# Patient Record
Sex: Female | Born: 1985 | Race: White | Hispanic: No | Marital: Single | Smoking: Current every day smoker
Health system: Southern US, Community
[De-identification: ages and names within clinical notes are randomized; demographics above are authoritative.]

## PROBLEM LIST (undated history)

## (undated) DIAGNOSIS — E041 Nontoxic single thyroid nodule: Secondary | ICD-10-CM

## (undated) DIAGNOSIS — E1065 Type 1 diabetes mellitus with hyperglycemia: Secondary | ICD-10-CM

## (undated) DIAGNOSIS — K219 Gastro-esophageal reflux disease without esophagitis: Secondary | ICD-10-CM

## (undated) DIAGNOSIS — E109 Type 1 diabetes mellitus without complications: Secondary | ICD-10-CM

## (undated) DIAGNOSIS — E039 Hypothyroidism, unspecified: Secondary | ICD-10-CM

## (undated) DIAGNOSIS — F17200 Nicotine dependence, unspecified, uncomplicated: Secondary | ICD-10-CM

## (undated) HISTORY — DX: Nontoxic single thyroid nodule: E04.1

## (undated) HISTORY — DX: Nicotine dependence, unspecified, uncomplicated: F17.200

## (undated) HISTORY — DX: Type 1 diabetes mellitus without complications: E10.9

## (undated) HISTORY — DX: Gastro-esophageal reflux disease without esophagitis: K21.9

## (undated) HISTORY — DX: Hypothyroidism, unspecified: E03.9

## (undated) HISTORY — DX: Type 1 diabetes mellitus with hyperglycemia: E10.65

---
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
## 2003-02-12 ENCOUNTER — Emergency Department (HOSPITAL_COMMUNITY): Admission: EM | Admit: 2003-02-12 | Discharge: 2003-02-12 | Payer: Self-pay | Admitting: Emergency Medicine

## 2003-02-12 ENCOUNTER — Encounter: Payer: Self-pay | Admitting: Emergency Medicine

## 2003-12-27 ENCOUNTER — Ambulatory Visit (HOSPITAL_COMMUNITY): Admission: RE | Admit: 2003-12-27 | Discharge: 2003-12-27 | Payer: Self-pay | Admitting: *Deleted

## 2004-05-05 ENCOUNTER — Inpatient Hospital Stay (HOSPITAL_COMMUNITY): Admission: AD | Admit: 2004-05-05 | Discharge: 2004-05-07 | Payer: Self-pay | Admitting: Obstetrics & Gynecology

## 2004-05-05 ENCOUNTER — Ambulatory Visit: Payer: Self-pay | Admitting: Obstetrics & Gynecology

## 2004-08-05 ENCOUNTER — Emergency Department (HOSPITAL_COMMUNITY): Admission: EM | Admit: 2004-08-05 | Discharge: 2004-08-05 | Payer: Self-pay | Admitting: Emergency Medicine

## 2005-01-12 ENCOUNTER — Emergency Department (HOSPITAL_COMMUNITY): Admission: EM | Admit: 2005-01-12 | Discharge: 2005-01-12 | Payer: Self-pay | Admitting: Emergency Medicine

## 2006-02-23 ENCOUNTER — Emergency Department (HOSPITAL_COMMUNITY): Admission: EM | Admit: 2006-02-23 | Discharge: 2006-02-23 | Payer: Self-pay | Admitting: Emergency Medicine

## 2006-02-26 ENCOUNTER — Emergency Department (HOSPITAL_COMMUNITY): Admission: EM | Admit: 2006-02-26 | Discharge: 2006-02-26 | Payer: Self-pay | Admitting: Emergency Medicine

## 2006-10-02 ENCOUNTER — Ambulatory Visit: Payer: Self-pay | Admitting: Endocrinology

## 2006-10-16 ENCOUNTER — Ambulatory Visit: Payer: Self-pay | Admitting: Endocrinology

## 2006-10-16 LAB — CONVERTED CEMR LAB: TSH: 11.68 microintl units/mL — ABNORMAL HIGH (ref 0.35–5.50)

## 2006-11-24 ENCOUNTER — Ambulatory Visit: Payer: Self-pay | Admitting: Endocrinology

## 2006-11-24 LAB — CONVERTED CEMR LAB: TSH: 3.93 microintl units/mL (ref 0.35–5.50)

## 2006-12-10 ENCOUNTER — Ambulatory Visit: Payer: Self-pay | Admitting: Oncology

## 2006-12-25 ENCOUNTER — Inpatient Hospital Stay (HOSPITAL_COMMUNITY): Admission: AD | Admit: 2006-12-25 | Discharge: 2006-12-25 | Payer: Self-pay | Admitting: Obstetrics and Gynecology

## 2007-01-27 ENCOUNTER — Ambulatory Visit: Payer: Self-pay | Admitting: Endocrinology

## 2007-01-27 LAB — CONVERTED CEMR LAB: TSH: 3.92 microintl units/mL (ref 0.35–5.50)

## 2007-02-09 ENCOUNTER — Inpatient Hospital Stay (HOSPITAL_COMMUNITY): Admission: AD | Admit: 2007-02-09 | Discharge: 2007-02-09 | Payer: Self-pay | Admitting: Obstetrics and Gynecology

## 2007-02-25 ENCOUNTER — Ambulatory Visit (HOSPITAL_COMMUNITY): Admission: RE | Admit: 2007-02-25 | Discharge: 2007-02-25 | Payer: Self-pay | Admitting: Obstetrics and Gynecology

## 2007-02-28 ENCOUNTER — Inpatient Hospital Stay (HOSPITAL_COMMUNITY): Admission: AD | Admit: 2007-02-28 | Discharge: 2007-02-28 | Payer: Self-pay | Admitting: Obstetrics and Gynecology

## 2007-03-05 ENCOUNTER — Inpatient Hospital Stay (HOSPITAL_COMMUNITY): Admission: AD | Admit: 2007-03-05 | Discharge: 2007-03-07 | Payer: Self-pay | Admitting: Obstetrics and Gynecology

## 2007-03-12 ENCOUNTER — Encounter: Payer: Self-pay | Admitting: *Deleted

## 2007-03-12 DIAGNOSIS — E039 Hypothyroidism, unspecified: Secondary | ICD-10-CM | POA: Insufficient documentation

## 2007-03-12 DIAGNOSIS — F17200 Nicotine dependence, unspecified, uncomplicated: Secondary | ICD-10-CM

## 2007-03-12 HISTORY — DX: Nicotine dependence, unspecified, uncomplicated: F17.200

## 2007-03-12 HISTORY — DX: Hypothyroidism, unspecified: E03.9

## 2008-02-18 ENCOUNTER — Ambulatory Visit: Payer: Self-pay | Admitting: Endocrinology

## 2008-02-18 DIAGNOSIS — E041 Nontoxic single thyroid nodule: Secondary | ICD-10-CM

## 2008-02-18 HISTORY — DX: Nontoxic single thyroid nodule: E04.1

## 2008-02-18 LAB — CONVERTED CEMR LAB: TSH: 0.07 microintl units/mL — ABNORMAL LOW (ref 0.35–5.50)

## 2008-03-07 ENCOUNTER — Encounter: Admission: RE | Admit: 2008-03-07 | Discharge: 2008-03-07 | Payer: Self-pay | Admitting: Endocrinology

## 2008-05-02 ENCOUNTER — Ambulatory Visit: Payer: Self-pay | Admitting: Endocrinology

## 2008-05-02 DIAGNOSIS — IMO0002 Reserved for concepts with insufficient information to code with codable children: Secondary | ICD-10-CM

## 2008-05-02 DIAGNOSIS — E1065 Type 1 diabetes mellitus with hyperglycemia: Secondary | ICD-10-CM

## 2008-05-02 HISTORY — DX: Type 1 diabetes mellitus with hyperglycemia: E10.65

## 2008-05-02 HISTORY — DX: Reserved for concepts with insufficient information to code with codable children: IMO0002

## 2008-05-02 LAB — CONVERTED CEMR LAB: Blood Glucose, Fingerstick: 203

## 2008-05-03 ENCOUNTER — Telehealth (INDEPENDENT_AMBULATORY_CARE_PROVIDER_SITE_OTHER): Payer: Self-pay | Admitting: *Deleted

## 2008-05-03 ENCOUNTER — Encounter: Payer: Self-pay | Admitting: Endocrinology

## 2008-05-03 ENCOUNTER — Telehealth: Payer: Self-pay | Admitting: Endocrinology

## 2008-05-04 ENCOUNTER — Encounter: Admission: RE | Admit: 2008-05-04 | Discharge: 2008-05-04 | Payer: Self-pay | Admitting: Endocrinology

## 2008-05-04 ENCOUNTER — Encounter: Payer: Self-pay | Admitting: Endocrinology

## 2008-05-05 ENCOUNTER — Telehealth (INDEPENDENT_AMBULATORY_CARE_PROVIDER_SITE_OTHER): Payer: Self-pay | Admitting: *Deleted

## 2008-05-08 ENCOUNTER — Encounter (INDEPENDENT_AMBULATORY_CARE_PROVIDER_SITE_OTHER): Payer: Self-pay | Admitting: *Deleted

## 2008-05-17 ENCOUNTER — Ambulatory Visit: Payer: Self-pay | Admitting: Endocrinology

## 2008-05-18 LAB — CONVERTED CEMR LAB: TSH: 1.93 microintl units/mL (ref 0.35–5.50)

## 2008-06-13 ENCOUNTER — Emergency Department (HOSPITAL_COMMUNITY): Admission: EM | Admit: 2008-06-13 | Discharge: 2008-06-14 | Payer: Self-pay | Admitting: Emergency Medicine

## 2008-06-13 ENCOUNTER — Telehealth: Payer: Self-pay | Admitting: Internal Medicine

## 2008-06-26 ENCOUNTER — Ambulatory Visit: Payer: Self-pay | Admitting: Endocrinology

## 2008-06-26 LAB — CONVERTED CEMR LAB: Hgb A1c MFr Bld: 5.2 % (ref 4.6–6.0)

## 2008-07-04 ENCOUNTER — Telehealth: Payer: Self-pay | Admitting: Endocrinology

## 2008-07-04 ENCOUNTER — Ambulatory Visit: Payer: Self-pay | Admitting: Internal Medicine

## 2008-07-04 DIAGNOSIS — E109 Type 1 diabetes mellitus without complications: Secondary | ICD-10-CM

## 2008-07-04 HISTORY — DX: Type 1 diabetes mellitus without complications: E10.9

## 2008-07-04 LAB — CONVERTED CEMR LAB
Inflenza A Ag: NEGATIVE
Influenza B Ag: NEGATIVE

## 2008-07-05 ENCOUNTER — Telehealth: Payer: Self-pay | Admitting: Internal Medicine

## 2008-07-26 ENCOUNTER — Telehealth: Payer: Self-pay | Admitting: Internal Medicine

## 2008-08-01 ENCOUNTER — Telehealth: Payer: Self-pay | Admitting: Internal Medicine

## 2008-09-25 ENCOUNTER — Ambulatory Visit: Payer: Self-pay | Admitting: Endocrinology

## 2008-09-25 LAB — CONVERTED CEMR LAB
Hgb A1c MFr Bld: 5.4 % (ref 4.6–6.5)
TSH: 11.29 microintl units/mL — ABNORMAL HIGH (ref 0.35–5.50)

## 2008-10-24 ENCOUNTER — Telehealth (INDEPENDENT_AMBULATORY_CARE_PROVIDER_SITE_OTHER): Payer: Self-pay | Admitting: *Deleted

## 2009-01-03 ENCOUNTER — Ambulatory Visit: Payer: Self-pay | Admitting: Internal Medicine

## 2009-01-03 DIAGNOSIS — R1013 Epigastric pain: Secondary | ICD-10-CM

## 2009-01-03 DIAGNOSIS — K219 Gastro-esophageal reflux disease without esophagitis: Secondary | ICD-10-CM

## 2009-01-03 HISTORY — DX: Gastro-esophageal reflux disease without esophagitis: K21.9

## 2009-01-03 LAB — CONVERTED CEMR LAB
ALT: 15 units/L (ref 0–35)
AST: 21 units/L (ref 0–37)
Albumin: 4.2 g/dL (ref 3.5–5.2)
Alkaline Phosphatase: 56 units/L (ref 39–117)
Amylase: 56 units/L (ref 27–131)
BUN: 6 mg/dL (ref 6–23)
Basophils Absolute: 0.1 10*3/uL (ref 0.0–0.1)
Basophils Relative: 0.7 % (ref 0.0–3.0)
Bilirubin Urine: NEGATIVE
Bilirubin, Direct: 0 mg/dL (ref 0.0–0.3)
CO2: 31 meq/L (ref 19–32)
Calcium: 9 mg/dL (ref 8.4–10.5)
Chloride: 107 meq/L (ref 96–112)
Creatinine, Ser: 0.7 mg/dL (ref 0.4–1.2)
Eosinophils Absolute: 0 10*3/uL (ref 0.0–0.7)
Eosinophils Relative: 0.1 % (ref 0.0–5.0)
GFR calc non Af Amer: 107.95 mL/min (ref >60)
Glucose, Bld: 92 mg/dL (ref 70–99)
HCT: 41.3 % (ref 36.0–46.0)
Hemoglobin: 14.2 g/dL (ref 12.0–15.0)
Hgb A1c MFr Bld: 5.1 % (ref 4.6–6.5)
Ketones, ur: NEGATIVE mg/dL
Leukocytes, UA: NEGATIVE
Lipase: 10 units/L — ABNORMAL LOW (ref 11.0–59.0)
Lymphocytes Relative: 18.7 % (ref 12.0–46.0)
Lymphs Abs: 1.5 10*3/uL (ref 0.7–4.0)
MCHC: 34.2 g/dL (ref 30.0–36.0)
MCV: 96.7 fL (ref 78.0–100.0)
Monocytes Absolute: 0.3 10*3/uL (ref 0.1–1.0)
Monocytes Relative: 4.1 % (ref 3.0–12.0)
Neutro Abs: 6.2 10*3/uL (ref 1.4–7.7)
Neutrophils Relative %: 76.4 % (ref 43.0–77.0)
Nitrite: NEGATIVE
Platelets: 183 10*3/uL (ref 150.0–400.0)
Potassium: 4 meq/L (ref 3.5–5.1)
RBC: 4.27 M/uL (ref 3.87–5.11)
RDW: 12.3 % (ref 11.5–14.6)
Sodium: 143 meq/L (ref 135–145)
Specific Gravity, Urine: 1.015 (ref 1.000–1.030)
TSH: 8.69 microintl units/mL — ABNORMAL HIGH (ref 0.35–5.50)
Total Bilirubin: 0.5 mg/dL (ref 0.3–1.2)
Total Protein, Urine: NEGATIVE mg/dL
Total Protein: 7 g/dL (ref 6.0–8.3)
Urine Glucose: NEGATIVE mg/dL
Urobilinogen, UA: 0.2 (ref 0.0–1.0)
WBC: 8.1 10*3/uL (ref 4.5–10.5)
pH: 7 (ref 5.0–8.0)

## 2009-01-03 LAB — HM DIABETES FOOT EXAM

## 2009-01-04 ENCOUNTER — Encounter: Payer: Self-pay | Admitting: Internal Medicine

## 2009-01-04 ENCOUNTER — Telehealth: Payer: Self-pay | Admitting: Internal Medicine

## 2009-06-05 ENCOUNTER — Ambulatory Visit: Payer: Self-pay | Admitting: Diagnostic Radiology

## 2009-06-05 ENCOUNTER — Emergency Department (HOSPITAL_BASED_OUTPATIENT_CLINIC_OR_DEPARTMENT_OTHER): Admission: EM | Admit: 2009-06-05 | Discharge: 2009-06-05 | Payer: Self-pay | Admitting: Emergency Medicine

## 2009-10-27 ENCOUNTER — Ambulatory Visit: Payer: Self-pay | Admitting: Diagnostic Radiology

## 2009-10-27 ENCOUNTER — Emergency Department (HOSPITAL_BASED_OUTPATIENT_CLINIC_OR_DEPARTMENT_OTHER): Admission: EM | Admit: 2009-10-27 | Discharge: 2009-10-27 | Payer: Self-pay | Admitting: Emergency Medicine

## 2009-10-29 ENCOUNTER — Emergency Department (HOSPITAL_BASED_OUTPATIENT_CLINIC_OR_DEPARTMENT_OTHER): Admission: EM | Admit: 2009-10-29 | Discharge: 2009-10-29 | Payer: Self-pay | Admitting: Emergency Medicine

## 2009-12-26 ENCOUNTER — Ambulatory Visit: Payer: Self-pay | Admitting: Endocrinology

## 2009-12-26 LAB — CONVERTED CEMR LAB
BUN: 9 mg/dL (ref 6–23)
Bilirubin Urine: NEGATIVE
CO2: 30 meq/L (ref 19–32)
Calcium: 9.2 mg/dL (ref 8.4–10.5)
Chloride: 102 meq/L (ref 96–112)
Cholesterol: 195 mg/dL (ref 0–200)
Creatinine, Ser: 0.8 mg/dL (ref 0.4–1.2)
Creatinine,U: 270.7 mg/dL
GFR calc non Af Amer: 91.83 mL/min (ref >60)
Glucose, Bld: 95 mg/dL (ref 70–99)
HDL: 101.3 mg/dL (ref >39.00)
Hemoglobin, Urine: NEGATIVE
Hgb A1c MFr Bld: 5.3 % (ref 4.6–6.5)
Ketones, ur: NEGATIVE mg/dL
LDL Cholesterol: 74 mg/dL (ref 0–99)
Leukocytes, UA: NEGATIVE
Microalb Creat Ratio: 1 mg/g (ref 0.0–30.0)
Microalb, Ur: 2.7 mg/dL — ABNORMAL HIGH (ref 0.0–1.9)
Nitrite: NEGATIVE
Potassium: 3.7 meq/L (ref 3.5–5.1)
Sodium: 140 meq/L (ref 135–145)
Specific Gravity, Urine: 1.025 (ref 1.000–1.030)
TSH: 5.22 microintl units/mL (ref 0.35–5.50)
Total CHOL/HDL Ratio: 2
Total Protein, Urine: NEGATIVE mg/dL
Triglycerides: 101 mg/dL (ref 0.0–149.0)
Urine Glucose: NEGATIVE mg/dL
Urobilinogen, UA: 0.2 (ref 0.0–1.0)
VLDL: 20.2 mg/dL (ref 0.0–40.0)
pH: 6.5 (ref 5.0–8.0)

## 2010-02-07 ENCOUNTER — Ambulatory Visit: Payer: Self-pay | Admitting: Endocrinology

## 2010-06-30 ENCOUNTER — Emergency Department (HOSPITAL_BASED_OUTPATIENT_CLINIC_OR_DEPARTMENT_OTHER)
Admission: EM | Admit: 2010-06-30 | Discharge: 2010-06-30 | Payer: Self-pay | Source: Home / Self Care | Admitting: Emergency Medicine

## 2010-07-03 LAB — GLUCOSE, CAPILLARY: Glucose-Capillary: 228 mg/dL — ABNORMAL HIGH (ref 70–99)

## 2010-07-16 NOTE — Assessment & Plan Note (Signed)
Summary: MED FU---STC   Vital Signs:  Patient profile:   25 year old female Height:      65 inches (165.10 cm) Weight:      105 pounds (47.73 kg) BMI:     17.54 O2 Sat:      97 % on Room air Temp:     98.7 degrees F (37.06 degrees C) oral Pulse rate:   67 / minute Pulse rhythm:   regular BP sitting:   126 / 74  (left arm) Cuff size:   regular  Vitals Entered By: Brenton Grills MA (December 26, 2009 10:56 AM)  O2 Flow:  Room air CC: Med F/U/refill request on Levothroid/Pt is not taking Flonase, Darvocet, Zantac or Protonix/aj   Primary Provider:  Minus Breeding MD  CC:  Med F/U/refill request on Levothroid/Pt is not taking Flonase, Darvocet, and Zantac or Protonix/aj.  History of Present Illness: dm:  no cbg record, but states cbg's are sometimes low before 1st meal of the day, or before lunch.  she says she has sometimes does not take insulin for a few days at a time, and cbg stays well-controlled. she says "sometimes i feel weak."  she says cbg goes "high (160) if i drink 2-3 beers." hypothyroidism:  she has been out of synthroid x 2 weeks.  no weight change.  Current Medications (verified): 1)  Flonase 50 Mcg/act Susp (Fluticasone Propionate) .... Use 1 Spray Each Nostril Two Times A Day 2)  Humalog Kwikpen 100 Unit/ml Soln (Insulin Lispro (Human)) .Marland Kitchen.. 1 Unit Three Times A Day (Qac) 3)  Bd U/f Short Pen Needle 31g X 8 Mm Misc (Insulin Pen Needle) .... Tid 4)  Prodigy Blood Glucose Test  Strp (Glucose Blood) .... Three Times A Day, and Lancets 250.01 5)  Levothroid 175 Mcg Tabs (Levothyroxine Sodium) .... Once Daily 6)  Zantac 150 Mg Caps (Ranitidine Hcl) .... At Bedtime 7)  Protonix 40 Mg Pack (Pantoprazole Sodium) .... Once Daily For Heartburn  Allergies (verified): No Known Drug Allergies  Past History:  Past Medical History: Last updated: 01/03/2009 Hypothyroidism Diabetes mellitus, type I GERD  Review of Systems  The patient denies dyspnea on exertion.     Physical Exam  General:  normal appearance.   Neck:  Supple without thyroid enlargement or tenderness.  Pulses:  dorsalis pedis intact bilat.  no carotid bruit  Extremities:  no deformity.  no ulcer on the feet.  feet are of normal color and temp.  no edema  Neurologic:  sensation is intact to touch on the feet    Impression & Recommendations:  Problem # 1:  DIABETES MELLITUS, TYPE I (ICD-250.01) she does not appear to need insulin  Problem # 2:  HYPOTHYROIDISM (ICD-244.9) she does not appear to need synthroid  Problem # 3:  SMOKER (ICD-305.1) persistent  Medications Added to Medication List This Visit: 1)  Humalog Kwikpen 100 Unit/ml Soln (Insulin lispro (human)) .Marland Kitchen.. 1 unit as needed for any blood sugar over 150, and pen needles # 30 2)  Prodigy Blood Glucose Test Strp (Glucose blood) .... Two times a day, and lancets 250.01  Other Orders: TLB-TSH (Thyroid Stimulating Hormone) (84443-TSH) TLB-A1C / Hgb A1C (Glycohemoglobin) (83036-A1C) TLB-Microalbumin/Creat Ratio, Urine (82043-MALB) TLB-Udip w/ Micro (81001-URINE) TLB-BMP (Basic Metabolic Panel-BMET) (80048-METABOL) TLB-Lipid Panel (80061-LIPID) Est. Patient Level III (16109)  Patient Instructions: 1)  blood tests are being ordered for you today.  please call [phone] to hear your test results. 2)  pending the test results, do not  take insulin on a regular basis.  just take 1 unit any time it is over 150. 3)  check your blood sugar 2 times a day.  vary the time of day when you check, between before the 3 meals, and at bedtime.  also check if you have symptoms of your blood sugar being too high or too low.  please keep a record of the readings and bring it to your next appointment here.  please call us sooner if you are having low blood sugar episodes. 4)  return asap if another pregnancy happens.  5)  resume synthroid at 175 micrograms/day.   6)  cc dr Ellyn Hack. 7)  Please schedule a follow-up appointment in 6  weeks. 8)  Quitting smoking can really improve your health.  Join a smoking cessation program now.    9)  (update: i left message on phone-tree:  rx as we discussed, except don't take synthroid). Prescriptions: LEVOTHROID 175 MCG TABS (LEVOTHYROXINE SODIUM) once daily  #30 x 1   Entered and Authorized by:   Minus Breeding MD   Signed by:   Minus Breeding MD on 12/26/2009   Method used:   Electronically to        [facility]  [phone]* (retail)       [address]       [place], [place]  [address]       Ph: [phone] or [phone]       Fax: [phone]   RxID:   [id number] PRODIGY BLOOD GLUCOSE TEST  STRP (GLUCOSE BLOOD) two times a day, and lancets 250.01  #60 x 11   Entered and Authorized by:   Minus Breeding MD   Signed by:   Minus Breeding MD on 12/26/2009   Method used:   Electronically to        [facility]  [phone]* (retail)       [address]       [place], [place]  [address]       Ph: [phone] or [phone]       Fax: [phone]   RxID:   [id number] HUMALOG KWIKPEN 100 UNIT/ML SOLN (INSULIN LISPRO (HUMAN)) 1 unit as needed for any blood sugar over 150, and pen needles # 30  #1 box x 11   Entered and Authorized by:   Minus Breeding MD   Signed by:   Minus Breeding MD on 12/26/2009   Method used:   Electronically to        [facility]  [phone]* (retail)       [address]       [place], [place]  [address]       Ph: [phone] or [phone]       Fax: [phone]   RxID:   [id number]

## 2010-08-16 ENCOUNTER — Telehealth: Payer: Self-pay | Admitting: Endocrinology

## 2010-08-22 NOTE — Progress Notes (Signed)
Summary: REQ FOR RX's   Phone Note Call from Patient   Caller: [name] [phone] OR [phone] Summary of Call: Pt's mother called. Pt is incarcerated and needs written rx's for insulin and thyroid meds. Pt is awaiting a bed at rehab facility and will need rx's to bring with her.  Initial call taken by: Lamar Sprinkles, CMA,  August 16, 2010 1:53 PM  Follow-up for Phone Call        i have printed.  please note that i have not seen this pt in 8 months, and i know nothing about her health since then.  therefore these are prescriptions for last known dosages for her, but i don't know if these are right for her now.   Follow-up by: Minus Breeding MD,  August 16, 2010 2:01 PM  Additional Follow-up for Phone Call Additional follow up Details #1::        rx placed upfront in cabinet, ready for pickup. Pt's mother informed. Additional Follow-up by: Brenton Grills CMA Duncan Dull),  August 16, 2010 3:41 PM    Prescriptions: LEVOTHROID 175 MCG TABS (LEVOTHYROXINE SODIUM) once daily  #30 x 0   Entered and Authorized by:   Minus Breeding MD   Signed by:   Minus Breeding MD on 08/16/2010   Method used:   Print then Give to Patient   RxID:   [id number] PRODIGY BLOOD GLUCOSE TEST  STRP (GLUCOSE BLOOD) two times a day, and lancets 250.01  #60 x 0   Entered and Authorized by:   Minus Breeding MD   Signed by:   Minus Breeding MD on 08/16/2010   Method used:   Print then Give to Patient   RxID:   [id number] BD U/F SHORT PEN NEEDLE 31G X 8 MM MISC (INSULIN PEN NEEDLE) tid  #100 x 0   Entered and Authorized by:   Minus Breeding MD   Signed by:   Minus Breeding MD on 08/16/2010   Method used:   Print then Give to Patient   RxID:   [id number] HUMALOG KWIKPEN 100 UNIT/ML SOLN (INSULIN LISPRO (HUMAN)) 1 unit as needed for any blood sugar over 150, and pen needles # 30  #1 box x 0   Entered and Authorized by:   Minus Breeding MD   Signed by:   Minus Breeding MD on 08/16/2010   Method used:   Print then  Give to Patient   RxID:   [id number]

## 2010-08-26 ENCOUNTER — Telehealth: Payer: Self-pay | Admitting: Endocrinology

## 2010-09-02 LAB — DIFFERENTIAL
Basophils Absolute: 0 10*3/uL (ref 0.0–0.1)
Basophils Relative: 1 % (ref 0–1)
Eosinophils Absolute: 0 10*3/uL (ref 0.0–0.7)
Eosinophils Relative: 1 % (ref 0–5)
Lymphocytes Relative: 16 % (ref 12–46)
Lymphs Abs: 1.2 10*3/uL (ref 0.7–4.0)
Monocytes Absolute: 0.4 10*3/uL (ref 0.1–1.0)
Monocytes Relative: 6 % (ref 3–12)
Neutro Abs: 5.5 10*3/uL (ref 1.7–7.7)
Neutrophils Relative %: 77 % (ref 43–77)

## 2010-09-02 LAB — CBC
HCT: 38.8 % (ref 36.0–46.0)
Hemoglobin: 13.3 g/dL (ref 12.0–15.0)
MCHC: 34.4 g/dL (ref 30.0–36.0)
MCV: 95.5 fL (ref 78.0–100.0)
Platelets: 155 10*3/uL (ref 150–400)
RBC: 4.06 MIL/uL (ref 3.87–5.11)
RDW: 12.3 % (ref 11.5–15.5)
WBC: 7.1 10*3/uL (ref 4.0–10.5)

## 2010-09-02 LAB — SEDIMENTATION RATE: Sed Rate: 2 mm/hr (ref 0–22)

## 2010-09-02 LAB — GLUCOSE, CAPILLARY: Glucose-Capillary: 93 mg/dL (ref 70–99)

## 2010-09-03 NOTE — Progress Notes (Signed)
Summary: Rx refill req  Phone Note Refill Request Message from:  Patient on August 26, 2010 10:14 AM  Refills Requested: Medication #1:  LEVOTHROID 175 MCG TABS once daily.   Dosage confirmed as above?Dosage Confirmed   Supply Requested: 1 month Pt's mother states that pt need t go to Rehab with [facility] because they are not authorized to dispense this medication. Mother advised that there will be no further refills until OV.   Method Requested: Electronic Initial call taken by: Margaret Pyle, CMA,  August 26, 2010 10:14 AM    Prescriptions: LEVOTHROID 175 MCG TABS (LEVOTHYROXINE SODIUM) once daily  #30 x 1   Entered by:   Margaret Pyle, CMA   Authorized by:   Minus Breeding MD   Signed by:   Margaret Pyle, CMA on 08/26/2010   Method used:   Electronically to        [facility]* (retail)       [address]       [place], [place]  [address]       Ph: [phone]       Fax: [phone]   RxID:   [id number]

## 2010-10-29 NOTE — Discharge Summary (Signed)
[name] [name], [name] [name]                  ACCOUNT NO.:  [id number]   MEDICAL RECORD NO.:  [id number]          PATIENT TYPE:  INP   LOCATION:  9136                          FACILITY:  [facility]   PHYSICIAN:  Malachi Pro. Ambrose Mantle, M.D. DATE OF BIRTH:  1985/08/20   DATE OF ADMISSION:  03/05/2007  DATE OF DISCHARGE:  03/07/2007                               DISCHARGE SUMMARY   HISTORY OF PRESENT ILLNESS:  A 25 year old white single female, para 1-0-  0-1 gravida 2, EDC March 06, 2007, admitted for induction of labor.  Blood group and type A negative, negative antibody, nonreactive  serology, rubella immune, hepatitis B surface antigen negative, HIV  negative, GC and Chlamydia negative.  TSH 20.506, free T4 0.72.  One-  hour Glucola 154, 3-hour GTT 77, 148, 155 and 140.  Group B strep was  positive.  Ultrasound on October 13, 2006:  Average gestational age [redacted]  weeks 3 days, [facility] March 06, 2007.  She began prenatal care at our  office on November 17, 2006.  Pap suggested Trichomonas and the patient was  treated with metronidazole.  Ultrasound on November 17, 2006:  Average  gestational age [redacted] weeks 3 days, [facility] March 06, 2007.  Family history  positive for von Willebrand's disease.  Records receive did not indicate  a problem for the patient.  The patient was begun on Synthroid for  hypothyroidism on January 27, 2007.  Size was less than dates.  Nonstress  tests were begun and have been reactive, although the patient has not  been compliant with keeping appointments.  On December 08, 2006, urine was  positive for cocaine.  On January 20, 2007, urine was positive for cocaine  and cannabinoids.   PAST MEDICAL HISTORY:  No known allergies.   OPERATIONS:  Knee surgery, 1991.   ILLNESSES:  Hypothyroidism.   SOCIAL HISTORY:  Cocaine addiction.  Alcohol and tobacco:  Positive for  tobacco.   FAMILY HISTORY:  Father with high blood pressure; mother with thyroid  dysfunction and diabetes; brother with seizure  disorder; maternal  grandfather with lung cancer; maternal grandmother, breast and lung  cancer.  Family members have von Willebrand's.   PHYSICAL EXAM:  VITAL SIGNS:  Normal vital signs.  HEART AND LUNGS:  Normal.  ABDOMEN:  Soft, fundal height 31.5 cm.  PELVIC:  On March 04, 2007, cervix a fingertip, 20% vertex at a -2.   HOSPITAL COURSE:  The patient was admitted for penicillin and Pitocin  for the positive group B strep.  She received an epidural because the  nurse felt she was 3-cm dilated.  By 1:01 p.m., the Pitocin was at 6 mU  a minute, contractions were every 2-3 minutes, fetal heart tones were  normal and were at a low baseline of 105-120.  The cervix at that time  by my exam was only 2 cm, 25%.  The patient had a fetal heart rate  deceleration on 1 mU a minute of Pitocin; Pitocin was discontinued, but  at 6:15 p.m., the contractions were every 5 minutes.  The cervix was 4  cm, 70% to 80%, vertex at a -2.  At 7:41 p.m., Pitocin was at 1 mU a  minute, contractions every 2-4 minutes.  Cervix was 5+ centimeters, 90%,  vertex at a -1.  She progressed to full dilatation.  She pushed well and  delivered spontaneously ROA over an intact perineum a living female  infant, 6 pounds 0 ounces, Apgars of 9 at 1 and 9 at 5 minutes.  Placenta was intact, uterus normal, blood loss about 400 mL.  Dr. Ambrose Mantle  was in attendance.  Postpartum, the patient did well and was discharged  on the second postpartum day.  The patient was seen in consultation by  Social Services.  The patient was advised to follow up with outpatient  drug treatment.  The social worker felt she had a good support system.  The father of the baby was supportive.  There was an active plan given  to remain clean.  The patient demonstrated good insight into what  triggers her substance use and good coping strategies to deal with the  triggers.  She demonstrated a commitment to remaining clean.  Laboratory  data showed initial  hemoglobin of 12.6, hematocrit 36.5, white count  10,700, platelet count 158,000.  Followup hemoglobin 11.8.  Comprehensive metabolic profile was normal except for studies that are  usually out of range in pregnancy.  Estimated GFR was greater than 60.  Urine tested for benzodiazepines, cocaine, amphetamines, cannabinoids,  opiates and barbiturates all showed nothing in her system.  Urinalysis  was clean.  RPR nonreactive.  Workup for RhoGAM was done and the patient  received her RhoGAM on March 06, 2007.  I offered to circumcise the  baby, but the pediatrician felt the baby should be evaluated for  Willebrand's disease before doing the circumcision.   FINAL DIAGNOSIS:  Intrauterine pregnancy at 39+ weeks, delivered vertex.   OPERATION:  Spontaneous delivery, vertex.   FINAL CONDITION:  Improved.   INSTRUCTIONS:  Instructions include our regular discharge instruction  booklet.  Percocet 5/325, twenty tablets, one every 4-6 hours as needed  for pain is given at discharge.  The patient is advised to return to our  office in 2 weeks for drug screen and in 6 weeks for followup  examination.  I have counseled her about the fact that Percocet is a  potentially eventuating drug and that she should use it with caution.      Malachi Pro. Ambrose Mantle, M.D.  Electronically Signed     TFH/MEDQ  D:  03/07/2007  T:  03/08/2007  Job:  [id number]

## 2010-10-29 NOTE — Consult Note (Signed)
[facility]                          ENDOCRINOLOGY CONSULTATION   NAME:[name], [name] BATSON                         MRN:          [id number]  DATE:01/27/2007                            DOB:          [birth year]    REASON FOR VISIT:  Follow up thyroid.   HISTORY OF THE PRESENT ILLNESS:  A 25 year old woman who states with  hypothyroidism.  She states she feels well and she is now [redacted] weeks  pregnant.  She has only gained 14 pounds of weight during her pregnancy,  but she states that Dr. Ambrose Mantle has told her this is fine.   PAST MEDICAL HISTORY:  Cigarette smoker.   REVIEW OF SYSTEMS:  She denies tremor and palpitations.   PHYSICAL EXAMINATION:  Blood pressure 127/70, heart rate is 80,  temperature is 97.2, the weight is 137.  GENERAL:  No distress.  The thyroid is slightly and diffusely enlarged.  Skin not diaphoretic.  NEUROLOGIC:  No tremor.   LABORATORY STUDIES:  On January 27, 2007, TSH normal at 3.92.   IMPRESSION:  1. Well controlled hypothyroidism.  2. Thirty-five weeks gestation.   PLAN:  1. Same Synthroid.  2. Return in about 3 months, or sooner if necessary.     Sean A. Everardo All, MD  Electronically Signed    SAE/MedQ  DD: 01/31/2007  DT: 02/01/2007  Job #: [id number]   cc:   Malachi Pro. Ambrose Mantle, M.D.

## 2010-10-29 NOTE — Consult Note (Signed)
[facility]                          ENDOCRINOLOGY CONSULTATION   NAME:[name] [name], [name] [name]                         MRN:          [id number]  DATE:11/24/2006                            DOB:          February 01, 1986    REASON FOR VISIT:  Follow up thyroid.   HISTORY OF THE PRESENT ILLNESS:  A 25 year old woman who takes Synthroid  150 mcg a day.  She is now [redacted] weeks pregnant and is feeling much less  tired since she has been on this Synthroid.   PAST MEDICAL HISTORY:  1. She is a smoker.  2. Gravida 2, para 1.   REVIEW OF SYSTEMS:  A 10 pound weight gain so far in her pregnancy.   PHYSICAL EXAMINATION:  Blood pressure is 122/74, heart rate is 88,  temperature is 98.2, the weight is 132.  GENERAL:  No distress.  Skin not diaphoretic, no rash.  NECK:  The thyroid appears minimally enlarged on the right and about  twice normal size on the left, but there is no discrete nodule.  NEUROLOGIC:  Alert, oriented.  Does not appear anxious nor depressed and  there is no tremor.   LABORATORY STUDIES:  On November 24, 2006, TSH normal at 3.93.   IMPRESSION:  1. Chronic hypothyroidism.  She is adequately replaced.  2. Twenty-five weeks gestation.   PLAN:  1. Continue Synthroid 150 mcg a day for now.  2. Return in 30 days.     Sean A. Everardo All, MD  Electronically Signed    SAE/MedQ  DD: 11/26/2006  DT: 11/27/2006  Job #: [id number]   cc:   Malachi Pro. Ambrose Mantle, M.D.

## 2010-11-01 NOTE — Consult Note (Signed)
[facility]                          ENDOCRINOLOGY CONSULTATION   NAME:[name] [name], [name] [name]                         MRN:          [id number]  DATE:10/02/2006                            DOB:          August 22, 1985    REFERRING PHYSICIAN:  Malachi Pro. Ambrose Mantle, M.D.   REASON FOR REFERRAL:  Hypothyroidism.   HISTORY OF THE PRESENT ILLNESS:  This is a 25 year old woman who states  she was diagnosed with hypothyroidism at the age of 43.  She states she  was prescribed Synthroid and took it consistently until she ran out of  it in 2006 following the birth of her child.  She states she did not  request a refill and has been it off it since then.  She is now [redacted] weeks  pregnant and she has had several months of fatigue with slightly  associated dry skin.   PAST MEDICAL HISTORY:  Gravida 2, para 1.  The only other medical  problem  is that she is a smoker.   SOCIAL HISTORY:  The patient is single.  She is unemployed.   FAMILY HISTORY:  The patient's mother had a thyroidectomy for uncertain  reasons.   REVIEW OF SYSTEMS:  The patient has gained 15 pounds so far in her  pregnancy.  She states minimal depression.   PHYSICAL EXAMINATION:  VITAL SIGNS:  Blood pressure 111/63, heart rate  83, temperature 97.6 and the weight is 132.  GENERAL APPEARANCE:  In general she is in no distress.  SKIN:  I cannot appreciate dry skin on today's examination.  EYES:  No proptosis.  No periorbital swelling.  NECK:  Thyroid is three times the normal size, left greater than the  right, with an irregular surface, but no discrete nodule.  CHEST:  The chest is clear to auscultation.  No respiratory distress.  EXTREMITIES:  The extremities have no edema.  NEUROLOGIC EXAMINATION:  Neurologically she is alert and oriented.  She  does not appear anxious or depressed, and there is no tremor.   LABORATORY STUDIES:  Laboratory studies forwarded by Dr. Ambrose Mantle:  On  September 28, 2006 TSH was 20.5.   IMPRESSION:  1. Chronic hypothyroidism, which is usually autoimmune even when      diagnosed at age 9.  2. History of nonadherence with her prescription.  3. Seventeen-week gestation.  4. The patient's cigarette smoking can worsen her thyroid disease as      well as increase the risk of her pregnancy.  5. Symptoms of fatigue and dry skin, which may be thyroid-related.   PLAN:  1. I discussed with her the important risk to her health of      nonadherence with her thyroid medication.  She states she will      comply with it in the future.  2. I gave her a brochure about hypothyroidism.  3. Because of the need for prompt normalization in her thyroid levels      I have prescribed her Synthroid 150 mcg a day.  4. Return here in about two weeks.     Sean A. Everardo All,  MD  Electronically Signed    SAE/MedQ  DD: 10/04/2006  DT: 10/04/2006  Job #: [id number]   cc:   Tracey Harries, M.D.

## 2011-02-20 ENCOUNTER — Ambulatory Visit (INDEPENDENT_AMBULATORY_CARE_PROVIDER_SITE_OTHER): Payer: Medicaid Other | Admitting: Endocrinology

## 2011-02-20 ENCOUNTER — Encounter: Payer: Self-pay | Admitting: Endocrinology

## 2011-02-20 ENCOUNTER — Other Ambulatory Visit (INDEPENDENT_AMBULATORY_CARE_PROVIDER_SITE_OTHER): Payer: Medicaid Other

## 2011-02-20 DIAGNOSIS — E039 Hypothyroidism, unspecified: Secondary | ICD-10-CM

## 2011-02-20 DIAGNOSIS — F1911 Other psychoactive substance abuse, in remission: Secondary | ICD-10-CM

## 2011-02-20 DIAGNOSIS — E109 Type 1 diabetes mellitus without complications: Secondary | ICD-10-CM

## 2011-02-20 LAB — LIPID PANEL
HDL: 68.5 mg/dL (ref >39.00)
LDL Cholesterol: 85 mg/dL (ref 0–99)
Total CHOL/HDL Ratio: 3
Triglycerides: 194 mg/dL — ABNORMAL HIGH (ref 0.0–149.0)

## 2011-02-20 LAB — BASIC METABOLIC PANEL
CO2: 30 mEq/L (ref 19–32)
Calcium: 8.6 mg/dL (ref 8.4–10.5)
Chloride: 102 mEq/L (ref 96–112)
Potassium: 3.7 mEq/L (ref 3.5–5.1)
Sodium: 138 mEq/L (ref 135–145)

## 2011-02-20 LAB — HEMOGLOBIN A1C: Hgb A1c MFr Bld: 5.1 % (ref 4.6–6.5)

## 2011-02-20 MED ORDER — LEVOTHYROXINE SODIUM 100 MCG PO TABS: 100.0000 ug | ORAL_TABLET | Freq: Every day | ORAL | Status: AC

## 2011-02-20 NOTE — Patient Instructions (Addendum)
blood tests are being requested for you today.  please call [phone] to hear your test results.  You will be prompted to enter the 9-digit "MRN" number that appears at the top left of this page, followed by #.  Then you will hear the message. Based on the thyroid result, i'll send a prescription to your pharmacy.  In view of your medical condition, you should avoid pregnancy until we have decided it is safe.  If the pregnancy happens, please come in for an appointment, so we can follow your thyroid and blood sugar. Cc bovard (update: i left message on phone-tree:  Take synthroid 100 mcg/d.  i have sent a prescription to your pharmacy.  Please make a follow-up appointment in 3 months.  No need for insulin, but check cbg a few times a week.  Call if over 200. (update: i left message on phone-tree:  rx as we discussed)

## 2011-02-20 NOTE — Progress Notes (Signed)
  Subjective:    Patient ID: [name] [name], female    DOB: Jan 14, 1986, 25 y.o.   MRN: [id number]  HPI The state of at least three ongoing medical problems is addressed today: Pt says she has a good result from in inpatient drug-rehab program--no recent drugs.  dm:  she says she has sometimes does not recently take insulin, and cbg stays well-controlled. hypothyroidism:  she has been taking synthroid, 1/2 of a 175 mcg tab per day.   Past Medical History  Diagnosis Date  . THYROID NODULE, RIGHT 02/18/2008  . HYPOTHYROIDISM 03/12/2007  . DIABETES MELLITUS, TYPE I 07/04/2008  . DIAB W/O MENTION COMP TYPE I [JUV TYPE] UNCNTRL 05/02/2008  . GERD 01/03/2009  . SMOKER 03/12/2007   No past surgical history on file.  History   Social History  . Marital Status: Single    Spouse Name: N/A    Number of Children: 1  . Years of Education: N/A   Occupational History  . 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 Smoking status: Current Everyday Smoker -- 0.5 packs/day for 10 years    Types: Cigarettes  . Smokeless tobacco: Not on file  . Alcohol Use: No  . Drug Use: No  . Sexually Active: Not on file   Other Topics Concern  . Not on file   Social History Narrative   Single. Mother of 71 month oldRegular exercise-no    Current Outpatient Prescriptions on File Prior to Visit  Medication Sig Dispense Refill  . glucose blood (PRODIGY TEST) test strip Use as instructed two times a da dx 250.01       . insulin lispro (HUMALOG KWIKPEN) 100 UNIT/ML injection 1 unit as needed for any blood sugar over 150       . Insulin Pen Needle (B-D ULTRAFINE III SHORT PEN) 31G X 8 MM MISC Tid         No Known Allergies  Family History  Problem Relation Age of Onset  . Diabetes Mother     takes oral agents for DM    BP 110/72  Pulse 73  Temp(Src) 98.7 F (37.1 C) (Oral)  Ht 5\' 7"  (1.702 m)  Wt 141 lb 12.8 oz (64.32 kg)  BMI 22.21 kg/m2  SpO2 97%  LMP 02/15/2011   Review of  Systems She has a few lbs of weight gain.  She has alternating cold and heat intolerance.  Menses are irregular, and she has cramps.      Objective:   Physical Exam VITAL SIGNS:  See vs page GENERAL: no distress There is no palpable thyroid enlargement.  No thyroid nodule is palpable.  No palpable lymphadenopathy at the anterior neck. Pulses: dorsalis pedis intact bilat.   Feet: no deformity.  no ulcer on the feet.  feet are of normal color and temp.  no edema Neuro: sensation is intact to touch on the feet.   (pt says her a1c was "good" in rehab) Hep-c and hiv are neg Lab Results  Component Value Date   TSH 5.86* 02/20/2011   Lab Results  Component Value Date   HGBA1C 5.1 02/20/2011      Assessment & Plan:  H/o drug abuse, better Hypothyroidism, needs increased rx Dm.  No med needed now.

## 2011-02-21 LAB — HIV ANTIBODY (ROUTINE TESTING W REFLEX): HIV: NONREACTIVE

## 2011-03-21 LAB — URINALYSIS, ROUTINE W REFLEX MICROSCOPIC
Glucose, UA: NEGATIVE mg/dL
pH: 6 (ref 5.0–8.0)

## 2011-03-21 LAB — DIFFERENTIAL
Basophils Relative: 3 % — ABNORMAL HIGH (ref 0–1)
Eosinophils Absolute: 0.1 10*3/uL (ref 0.0–0.7)
Monocytes Absolute: 0.3 10*3/uL (ref 0.1–1.0)
Neutro Abs: 5.7 10*3/uL (ref 1.7–7.7)
Neutrophils Relative %: 77 % (ref 43–77)

## 2011-03-21 LAB — BASIC METABOLIC PANEL
BUN: 13 mg/dL (ref 6–23)
CO2: 27 mEq/L (ref 19–32)
Calcium: 8.7 mg/dL (ref 8.4–10.5)
Chloride: 105 mEq/L (ref 96–112)
Creatinine, Ser: 0.61 mg/dL (ref 0.4–1.2)
GFR calc Af Amer: >60 mL/min (ref >60)
GFR calc non Af Amer: >60 mL/min (ref >60)
Glucose, Bld: 91 mg/dL (ref 70–99)
Potassium: 3.5 mEq/L (ref 3.5–5.1)
Sodium: 138 mEq/L (ref 135–145)

## 2011-03-21 LAB — CBC
HCT: 39.7 % (ref 36.0–46.0)
Hemoglobin: 13.9 g/dL (ref 12.0–15.0)
MCHC: 35 g/dL (ref 30.0–36.0)
RDW: 12.3 % (ref 11.5–15.5)

## 2011-03-21 LAB — PREGNANCY, URINE: Preg Test, Ur: NEGATIVE

## 2011-03-21 LAB — URINE MICROSCOPIC-ADD ON

## 2011-03-21 LAB — GLUCOSE, CAPILLARY: Glucose-Capillary: 105 mg/dL — ABNORMAL HIGH (ref 70–99)

## 2011-03-27 LAB — RAPID URINE DRUG SCREEN, HOSP PERFORMED
Amphetamines: NOT DETECTED
Barbiturates: NOT DETECTED
Benzodiazepines: NOT DETECTED
Opiates: NOT DETECTED

## 2011-03-27 LAB — COMPREHENSIVE METABOLIC PANEL
Albumin: 2.6 — ABNORMAL LOW
BUN: 5 — ABNORMAL LOW
Chloride: 105
Creatinine, Ser: 0.49
GFR calc non Af Amer: >60
Total Bilirubin: 0.6

## 2011-03-27 LAB — RH IMMUNE GLOB WKUP(>/=20WKS)(NOT WOMEN'S HOSP): Fetal Screen: NEGATIVE

## 2011-03-27 LAB — LACTATE DEHYDROGENASE: LDH: 121

## 2011-03-27 LAB — URIC ACID: Uric Acid, Serum: 3.2

## 2011-03-27 LAB — URINALYSIS, DIPSTICK ONLY
Glucose, UA: NEGATIVE
Ketones, ur: NEGATIVE
Leukocytes, UA: NEGATIVE
Protein, ur: NEGATIVE

## 2011-03-27 LAB — CBC
HCT: 33.5 — ABNORMAL LOW
Hemoglobin: 12.6
Platelets: 133 — ABNORMAL LOW
RBC: 4.07
RDW: 14.3 — ABNORMAL HIGH
WBC: 10.7 — ABNORMAL HIGH
WBC: 13.8 — ABNORMAL HIGH

## 2011-04-01 LAB — GLUCOSE TOLERANCE, 1 HOUR: Glucose, 1 Hour GTT: 154 — ABNORMAL HIGH

## 2011-04-01 LAB — RH IMMUNE GLOBULIN WORKUP (NOT WOMEN'S HOSP)

## 2011-04-01 LAB — RPR: RPR Ser Ql: NONREACTIVE

## 2011-04-01 LAB — HIV ANTIBODY (ROUTINE TESTING W REFLEX): HIV: NONREACTIVE

## 2011-06-05 ENCOUNTER — Ambulatory Visit: Payer: Medicaid Other | Admitting: Endocrinology

## 2011-06-23 ENCOUNTER — Ambulatory Visit: Payer: Medicaid Other | Admitting: Endocrinology

## 2011-06-23 DIAGNOSIS — Z0289 Encounter for other administrative examinations: Secondary | ICD-10-CM

## 2011-08-19 ENCOUNTER — Ambulatory Visit: Payer: Medicaid Other | Admitting: Endocrinology

## 2011-12-20 IMAGING — CR DG HAND COMPLETE 3+V*R*
3 series · 3 of 3 positions shown · non-contrast
Comparison: None.

CLINICAL DATA: Laceration

RIGHT HAND - COMPLETE 3+ VIEW

[x hand pa right]
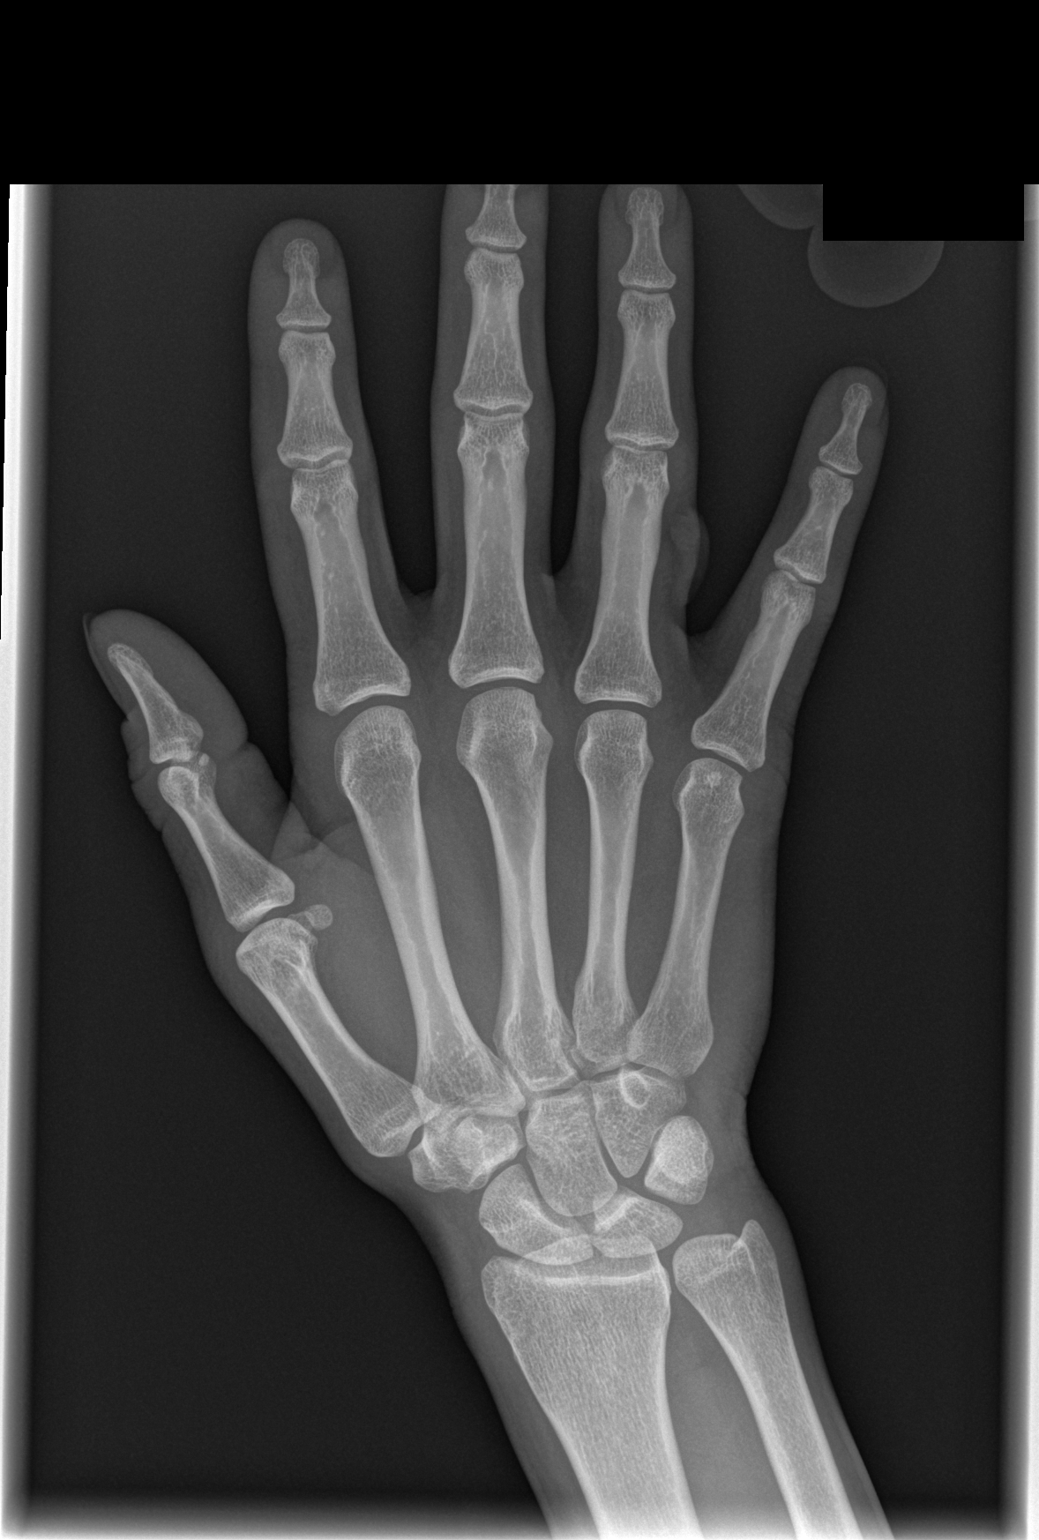

[x hand oblique right]
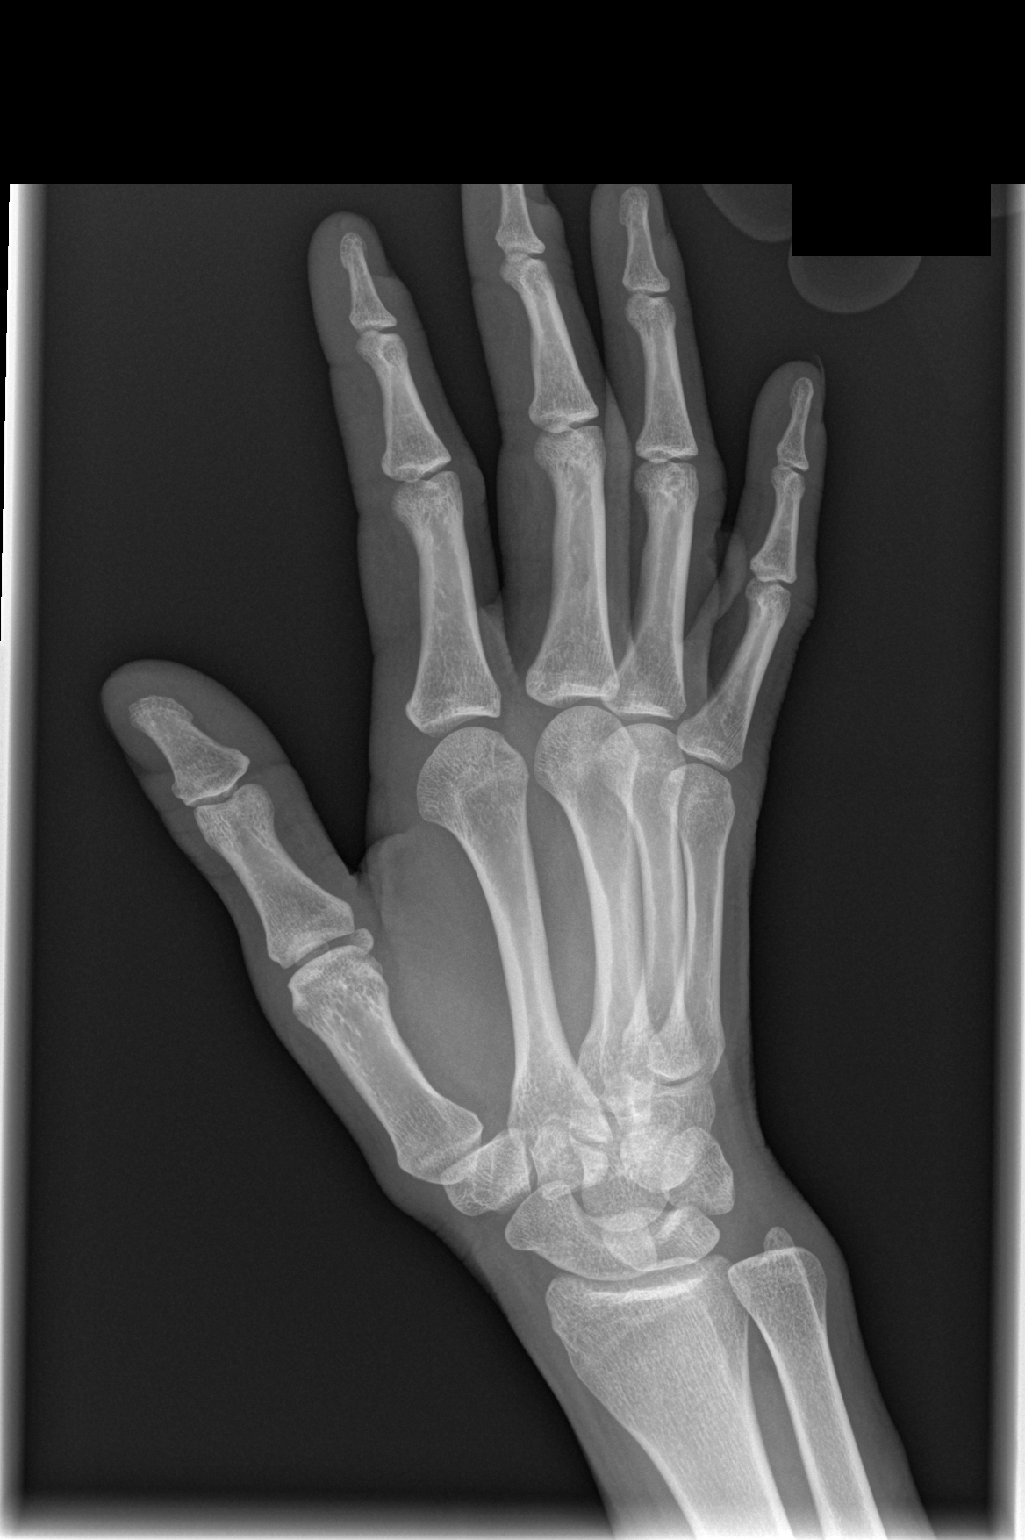

[x hand lat right]
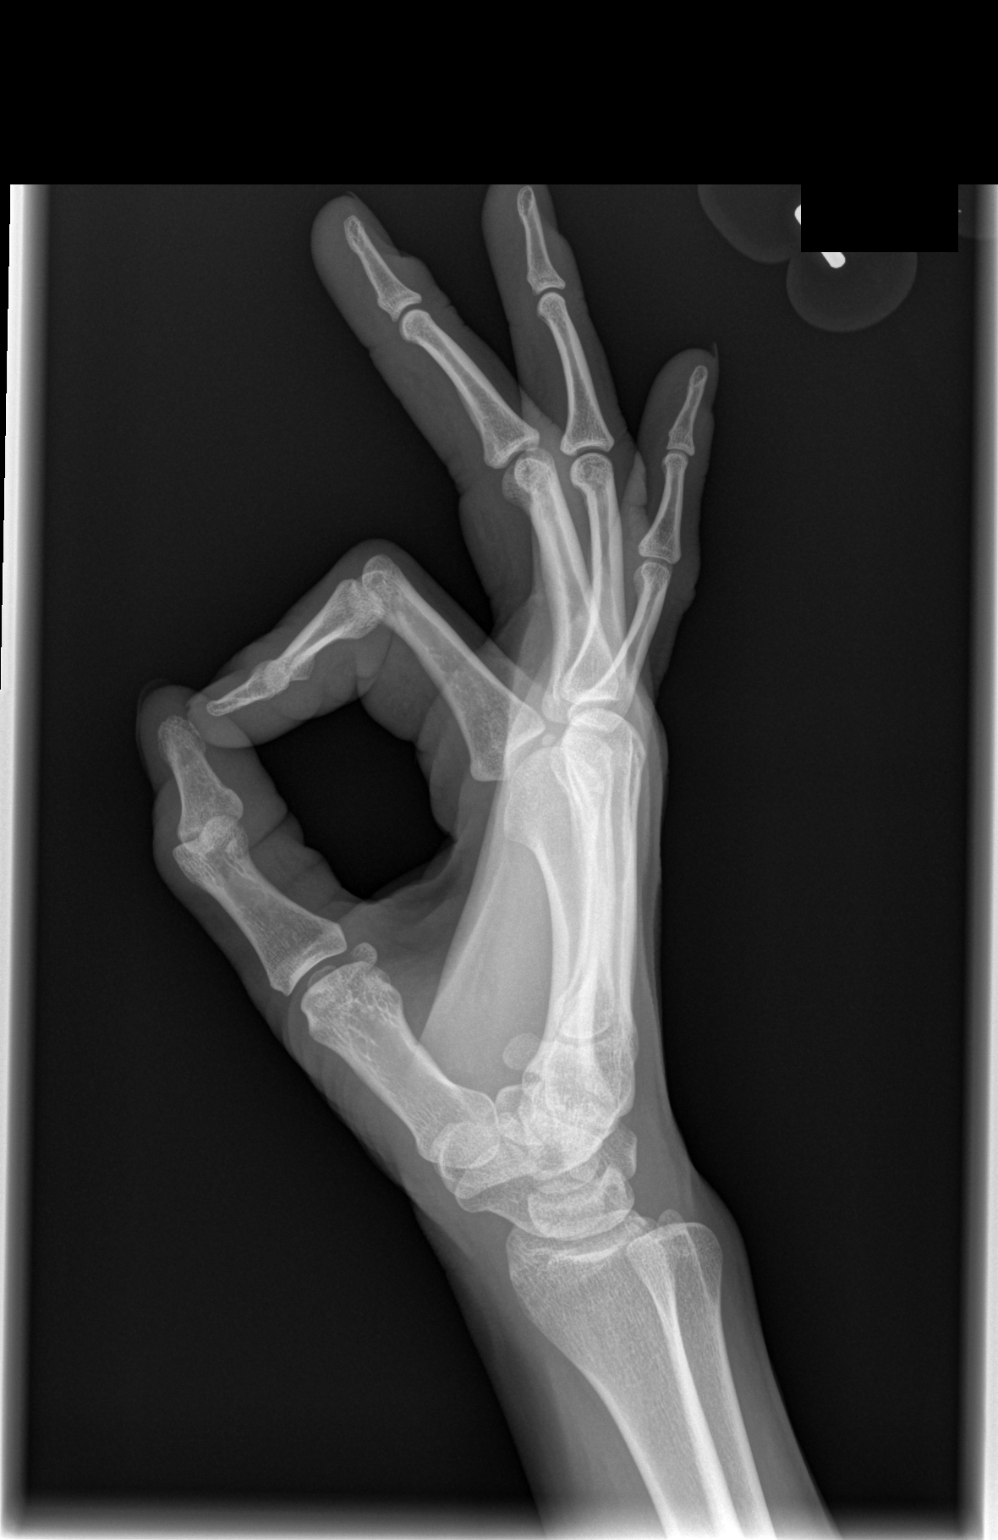

[3 of 3 positions shown; findings below may reference images not displayed]

FINDINGS: Three views of the right hand submitted .No acute
fracture or subluxation.  There is soft tissue irregularity
probable laceration adjacent to proximal phalanx right fourth
finger.
IMPRESSION: No acute fracture or subluxation.  Probable soft tissue laceration
right fourth finger.

## 2012-08-22 IMAGING — CR DG CHEST 2V
2 series · 2 of 2 positions shown · non-contrast
Comparison: Chest 06/05/2009.

CLINICAL DATA: Chest pain and cough.

CHEST - 2 VIEW

[w chest pa]
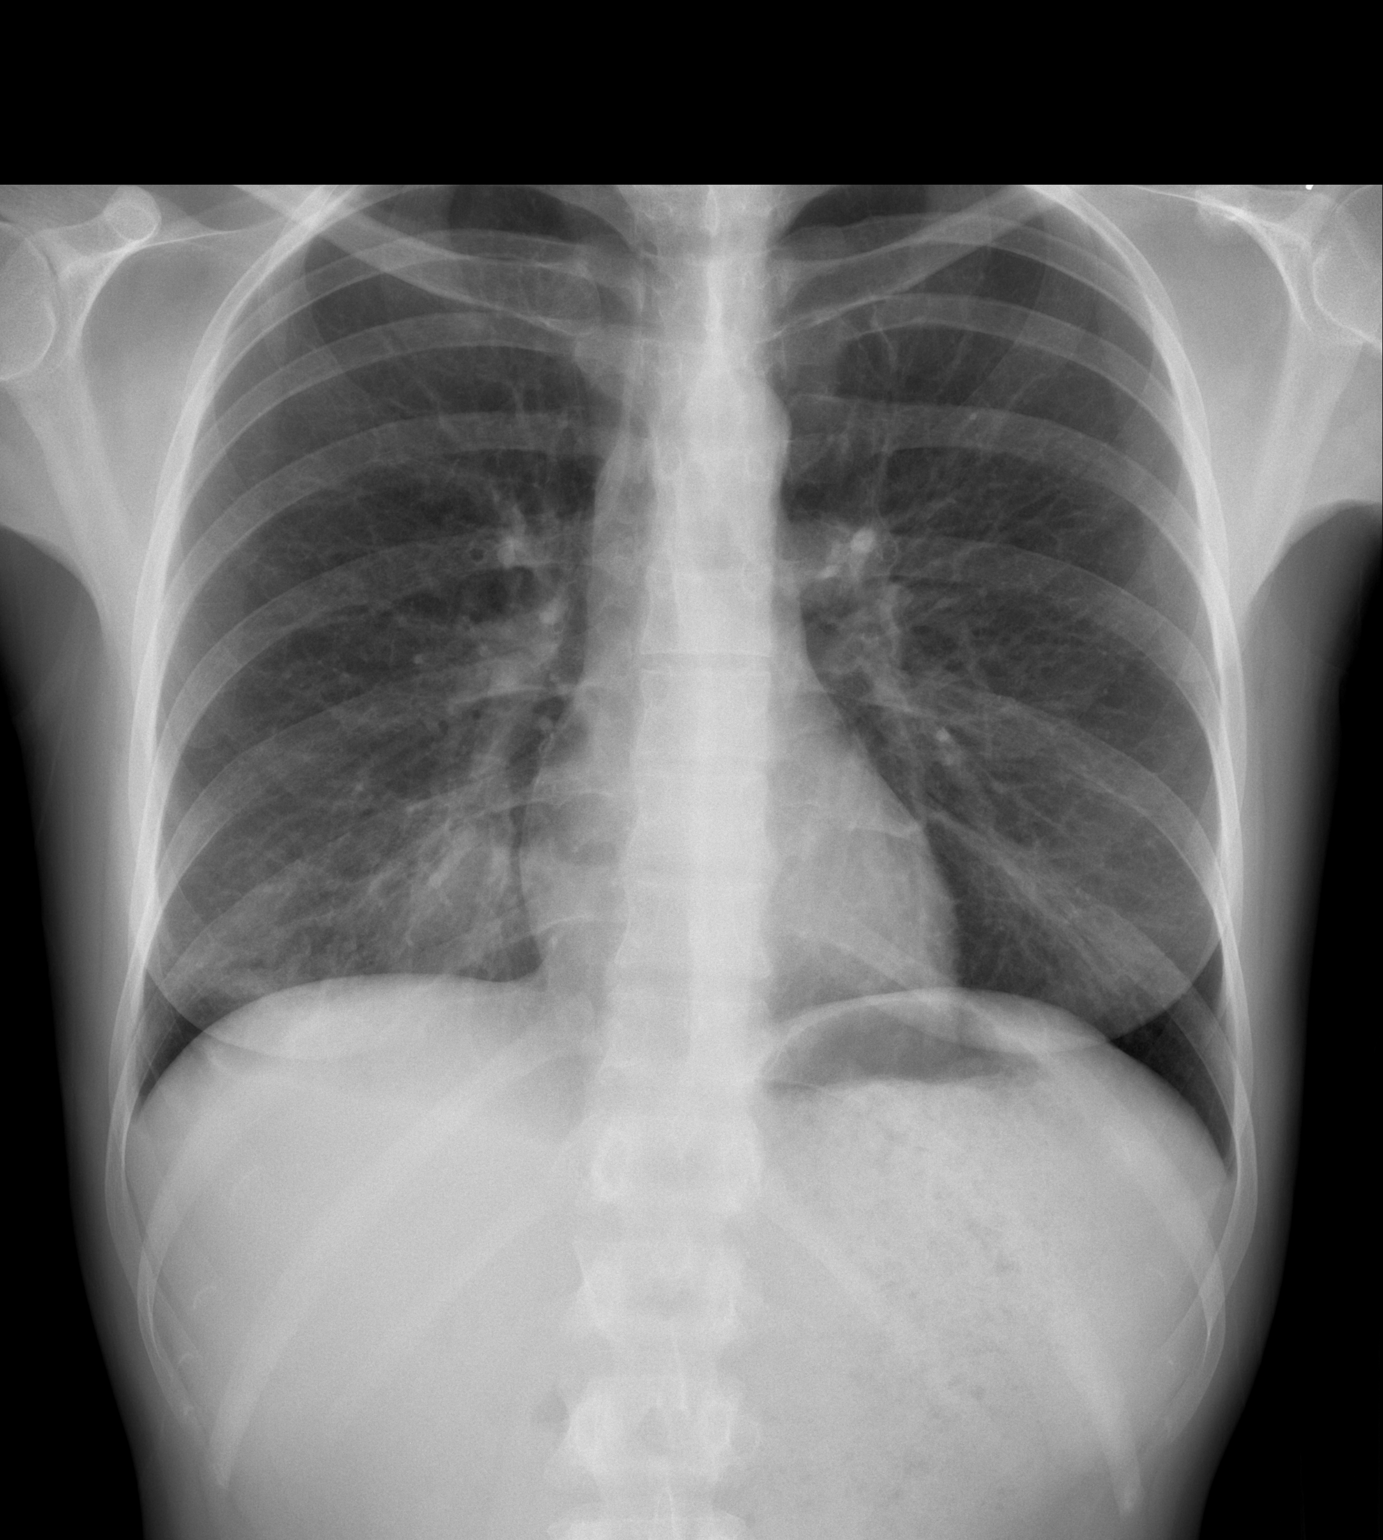

[w chest lat]
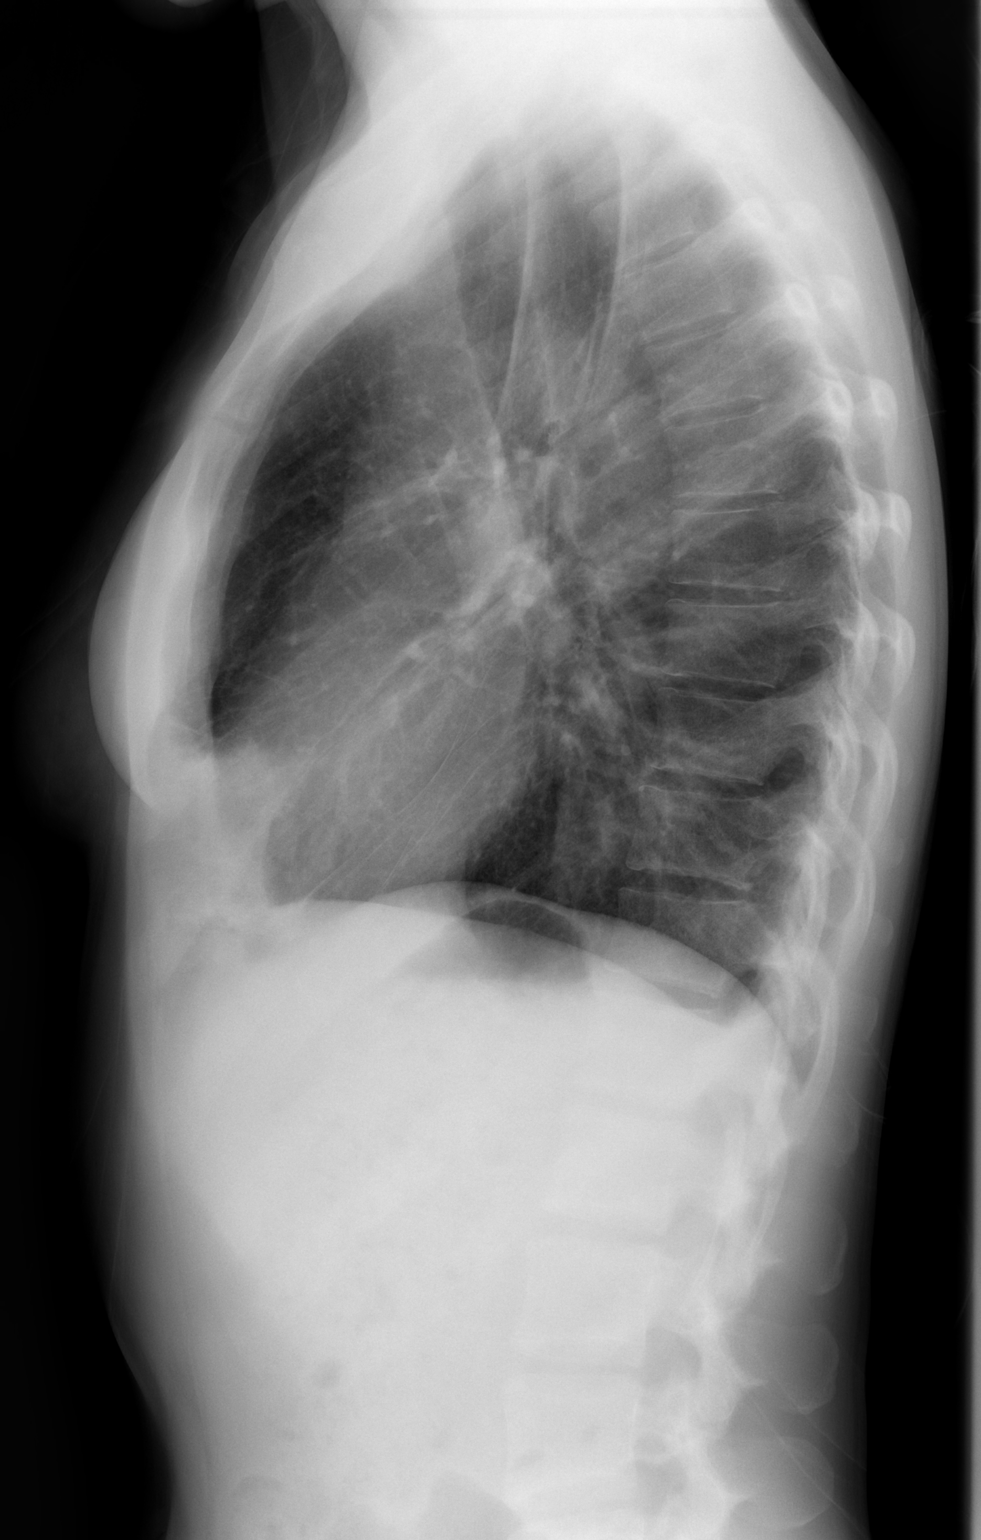

[2 of 2 positions shown; findings below may reference images not displayed]

FINDINGS: Focal airspace disease is seen in the right lower lobe.
Left lung is clear.  Heart size normal.
IMPRESSION: Focal right lower lobe airspace disease consistent with pneumonia.

## 2012-12-08 ENCOUNTER — Encounter (HOSPITAL_COMMUNITY): Payer: Self-pay | Admitting: *Deleted

## 2012-12-08 ENCOUNTER — Emergency Department (HOSPITAL_COMMUNITY)
Admission: EM | Admit: 2012-12-08 | Discharge: 2012-12-08 | Disposition: A | Discharge status: Home / Self Care | Payer: Medicaid Other | Attending: Emergency Medicine | Admitting: Emergency Medicine

## 2012-12-08 DIAGNOSIS — Z79899 Other long term (current) drug therapy: Secondary | ICD-10-CM | POA: Insufficient documentation

## 2012-12-08 DIAGNOSIS — F1420 Cocaine dependence, uncomplicated: Secondary | ICD-10-CM | POA: Insufficient documentation

## 2012-12-08 DIAGNOSIS — E109 Type 1 diabetes mellitus without complications: Secondary | ICD-10-CM | POA: Insufficient documentation

## 2012-12-08 DIAGNOSIS — E039 Hypothyroidism, unspecified: Secondary | ICD-10-CM | POA: Insufficient documentation

## 2012-12-08 DIAGNOSIS — Z8719 Personal history of other diseases of the digestive system: Secondary | ICD-10-CM | POA: Insufficient documentation

## 2012-12-08 DIAGNOSIS — Z794 Long term (current) use of insulin: Secondary | ICD-10-CM | POA: Insufficient documentation

## 2012-12-08 DIAGNOSIS — E041 Nontoxic single thyroid nodule: Secondary | ICD-10-CM | POA: Insufficient documentation

## 2012-12-08 DIAGNOSIS — F17200 Nicotine dependence, unspecified, uncomplicated: Secondary | ICD-10-CM | POA: Insufficient documentation

## 2012-12-08 NOTE — ED Notes (Signed)
Discussed with PA Britta Mccreedy about ordering labs at this time, decision was made to wait at this time, no new orders placed.

## 2012-12-08 NOTE — ED Notes (Signed)
Pt dc'd home w/all belongings, alert and ambulatory upon dc, pt verbalizes understanding of dc instructions and to f/u with the detox center EDPA sent her to. Pt driven home by mother

## 2012-12-08 NOTE — ED Provider Notes (Signed)
History    CSN: [id number] Arrival date & time 12/08/12  1227  First MD Initiated Contact with Patient 12/08/12 1410     Chief Complaint  Patient presents with  . Medical Clearance   (Consider location/radiation/quality/duration/timing/severity/associated sxs/prior Treatment) HPI Comments: 27 y.o. Female with hx of cocaine abuse, DM1, and hypothyroid presents to the ED for assistance with "detox" from cocaine. Pt has been using cocaine on and off for 8 years. She has attended several inpatient rehab program with varying degrees of success. The longest period of time she remained clean was about 17 months in 2010. Pt states that she uses several times a week and last use was 2 days ago. 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 She has since moved back with her mother who is supportive of her decision to try to stay clean again. Pt had a baby 11/21/12 who is with her mother.   Pt denies SI/HI, audio/visual hallucination, fever, shortness of breath, chest pain, abdominal pain, numbness. Her only somatic complaint is what she called an "abscess" in the left groin area that she would like to have evaluated.   Past Medical History  Diagnosis Date  . THYROID NODULE, RIGHT 02/18/2008  . HYPOTHYROIDISM 03/12/2007  . DIABETES MELLITUS, TYPE I 07/04/2008  . DIAB W/O MENTION COMP TYPE I [JUV TYPE] UNCNTRL 05/02/2008  . GERD 01/03/2009  . SMOKER 03/12/2007   History reviewed. No pertinent past surgical history. Family History  Problem Relation Age of Onset  . Diabetes Mother     takes oral agents for DM   History  Substance Use Topics  . Smoking status: Current Every Day Smoker -- 0.50 packs/day for 10 years    Types: Cigarettes  . Smokeless tobacco: Not on file  . Alcohol Use: No   OB History   Grav Para Term Preterm Abortions TAB SAB Ect Mult Living                 Review of Systems  Constitutional: Negative for fever and diaphoresis.  HENT: Negative for neck  pain and neck stiffness.   Eyes: Negative for visual disturbance.  Respiratory: Negative for apnea, chest tightness and shortness of breath.   Cardiovascular: Negative for chest pain and palpitations.  Gastrointestinal: Negative for nausea, vomiting, diarrhea and constipation.  Genitourinary: Negative for dysuria.  Musculoskeletal: Negative for gait problem.  Skin: Negative for rash.       Left groin, small area of redness, tenderness.   Neurological: Negative for dizziness, weakness, light-headedness, numbness and headaches.    Allergies  Review of patient's allergies indicates no known allergies.  Home Medications   Current Outpatient Rx  Name  Route  Sig  Dispense  Refill  . glucose blood (PRODIGY TEST) test strip      Use as instructed two times a da dx 250.01          . insulin lispro (HUMALOG KWIKPEN) 100 UNIT/ML injection      1 unit as needed for any blood sugar over 150          . Insulin Pen Needle (B-D ULTRAFINE III SHORT PEN) 31G X 8 MM MISC      Tid          . levothyroxine (SYNTHROID, LEVOTHROID) 100 MCG tablet   Oral   Take 100 mcg by mouth daily before breakfast.         . EXPIRED: levothyroxine (SYNTHROID, LEVOTHROID) 100 MCG tablet  Oral   Take 1 tablet (100 mcg total) by mouth daily.   30 tablet   5    BP 132/93  Pulse 78  Temp(Src) 98.5 F (36.9 C) (Oral)  Resp 20  SpO2 100% Physical Exam  Nursing note and vitals reviewed. Constitutional: She is oriented to person, place, and time. No distress.  Disheveled  HENT:  Head: Normocephalic and atraumatic.  Eyes: Conjunctivae and EOM are normal.  Neck: Normal range of motion. Neck supple.  No meningeal signs  Cardiovascular: Normal rate, regular rhythm and normal heart sounds.  Exam reveals no gallop and no friction rub.   No murmur heard. Pulmonary/Chest: Effort normal and breath sounds normal. No respiratory distress. She has no wheezes. She has no rales. She exhibits no tenderness.   Abdominal: Soft. Bowel sounds are normal. She exhibits no distension. There is no tenderness. There is no rebound and no guarding.  Musculoskeletal: Normal range of motion. She exhibits no edema and no tenderness.  FROM to upper and lower extremities  Neurological: She is alert and oriented to person, place, and time. No cranial nerve deficit.  Speech is clear and goal oriented, follows commands Sensation normal to light touch Moves extremities without ataxia, coordination intact Normal gait and balance Normal strength in upper and lower extremities bilaterally including dorsiflexion and plantar flexion, strong and equal grip strength   Skin: Skin is warm and dry. She is not diaphoretic. No erythema.  Area of redness and tenderness, mild swelling in left groin area. No fluctuant mass, pointing, induration, or actively draining pus.   Psychiatric:  No SI/HI, audio/visual hallucinations    ED Course  Procedures (including critical care time) Labs Reviewed  URINE RAPID DRUG SCREEN (HOSP PERFORMED)   No results found. 1. Cocaine addiction     MDM  Pt seeking "detox" for cocaine after 8 years of use off and on. Explained to pt there is no medical detox from cocaine as there is with alcohol or benzos. Discussed with pt options for rehab. Went over behavioral health resources with her, suggested [facility] which seemed to be agreeable to the patient. "I've heard good things about that place."  Area of redness and swelling in left groin area appeared to be an ingrown hair where pt shaves her pubic hairs. No fluctuant mass, pointing, induration, or actively draining pus.   Discussed pt case with Dr. Norlene Campbell who agrees that pt is appropriate for discharge. Discussed reasons to seek immediate care. Patient expresses understanding and agrees with plan.   Glade Nurse, PA-C 12/08/12 1601

## 2012-12-08 NOTE — ED Notes (Addendum)
Pt in requesting detox from cocaine, states her last use was 2 days ago, states she wants help getting into a 14 day program. Pt also states she has an abscess on her left leg near her groin area that she would like to have evaluated. Pt also states she uses ETOH, last use was also 2 days ago, denies SI/HI. Pt states she is two weeks post partum.

## 2012-12-09 NOTE — ED Provider Notes (Signed)
Medical screening examination/treatment/procedure(s) were performed by non-physician practitioner and as supervising physician I was immediately available for consultation/collaboration.  Jerah Esty M Rusti Arizmendi, MD 12/09/12 1418 

## 2013-09-14 ENCOUNTER — Encounter (HOSPITAL_BASED_OUTPATIENT_CLINIC_OR_DEPARTMENT_OTHER): Payer: Self-pay | Admitting: Emergency Medicine

## 2013-09-14 ENCOUNTER — Emergency Department (HOSPITAL_BASED_OUTPATIENT_CLINIC_OR_DEPARTMENT_OTHER)
Admission: EM | Admit: 2013-09-14 | Discharge: 2013-09-14 | Disposition: A | Discharge status: Home / Self Care | Payer: Medicaid Other | Attending: Emergency Medicine | Admitting: Emergency Medicine

## 2013-09-14 DIAGNOSIS — IMO0002 Reserved for concepts with insufficient information to code with codable children: Secondary | ICD-10-CM | POA: Insufficient documentation

## 2013-09-14 DIAGNOSIS — F17200 Nicotine dependence, unspecified, uncomplicated: Secondary | ICD-10-CM | POA: Insufficient documentation

## 2013-09-14 DIAGNOSIS — Z8719 Personal history of other diseases of the digestive system: Secondary | ICD-10-CM | POA: Insufficient documentation

## 2013-09-14 DIAGNOSIS — E039 Hypothyroidism, unspecified: Secondary | ICD-10-CM | POA: Insufficient documentation

## 2013-09-14 DIAGNOSIS — L03319 Cellulitis of trunk, unspecified: Principal | ICD-10-CM

## 2013-09-14 DIAGNOSIS — Z792 Long term (current) use of antibiotics: Secondary | ICD-10-CM | POA: Insufficient documentation

## 2013-09-14 DIAGNOSIS — L02219 Cutaneous abscess of trunk, unspecified: Secondary | ICD-10-CM | POA: Insufficient documentation

## 2013-09-14 DIAGNOSIS — Z794 Long term (current) use of insulin: Secondary | ICD-10-CM | POA: Insufficient documentation

## 2013-09-14 DIAGNOSIS — E1065 Type 1 diabetes mellitus with hyperglycemia: Secondary | ICD-10-CM | POA: Insufficient documentation

## 2013-09-14 DIAGNOSIS — L02214 Cutaneous abscess of groin: Secondary | ICD-10-CM

## 2013-09-14 MED ORDER — OXYCODONE-ACETAMINOPHEN 5-325 MG PO TABS: 2.0000 | ORAL_TABLET | ORAL | Status: AC | PRN

## 2013-09-14 MED ORDER — OXYCODONE-ACETAMINOPHEN 5-325 MG PO TABS
2.0000 | ORAL_TABLET | Freq: Once | ORAL | Status: AC
  Administered 2013-09-14: 2 via ORAL
  Filled 2013-09-14: qty 2

## 2013-09-14 MED ORDER — SULFAMETHOXAZOLE-TRIMETHOPRIM 800-160 MG PO TABS: 1.0000 | ORAL_TABLET | Freq: Two times a day (BID) | ORAL | Status: AC

## 2013-09-14 NOTE — ED Provider Notes (Signed)
Medical screening examination/treatment/procedure(s) were performed by non-physician practitioner and as supervising physician I was immediately available for consultation/collaboration.   EKG Interpretation None        Betul Brisky, MD 09/14/13 2354 

## 2013-09-14 NOTE — ED Notes (Signed)
Pt sts abcess x 2 days, significantly more pain starting yesterday; hot to touch over abcess, denies fever/ chills.

## 2013-09-14 NOTE — ED Provider Notes (Signed)
CSN: [id number]     Arrival date & time 09/14/13  1732 History   First MD Initiated Contact with Patient 09/14/13 2018     Chief Complaint  Patient presents with  . Abscess     (Consider location/radiation/quality/duration/timing/severity/associated sxs/prior Treatment) Patient is a 28 y.o. female presenting with abscess. The history is provided by the patient. No language interpreter was used.  Abscess Location:  Torso Torso abscess location:  Abd RLQ Size:  2 cm Abscess quality: painful, redness and warmth   Abscess quality: not draining   Red streaking: no   Duration:  2 days Progression:  Worsening Pain details:    Quality:  Pressure and throbbing   Duration:  2 days   Timing:  Constant   Progression:  Worsening Chronicity:  New Context: diabetes   Relieved by:  Nothing Worsened by:  Nothing tried Ineffective treatments:  Warm compresses   Past Medical History  Diagnosis Date  . THYROID NODULE, RIGHT 02/18/2008  . HYPOTHYROIDISM 03/12/2007  . DIABETES MELLITUS, TYPE I 07/04/2008  . DIAB W/O MENTION COMP TYPE I [JUV TYPE] UNCNTRL 05/02/2008  . GERD 01/03/2009  . SMOKER 03/12/2007   History reviewed. No pertinent past surgical history. Family History  Problem Relation Age of Onset  . Diabetes Mother     takes oral agents for DM   History  Substance Use Topics  . Smoking status: Current Every Day Smoker -- 0.50 packs/day for 10 years    Types: Cigarettes  . Smokeless tobacco: Not on file  . Alcohol Use: Yes     Comment: occasional   OB History   Grav Para Term Preterm Abortions TAB SAB Ect Mult Living                 Review of Systems  Skin: Positive for wound.  All other systems reviewed and are negative.      Allergies  Review of patient's allergies indicates no known allergies.  Home Medications   Current Outpatient Rx  Name  Route  Sig  Dispense  Refill  . glucose blood (PRODIGY TEST) test strip      Use as instructed two times a da dx 250.01           . insulin lispro (HUMALOG KWIKPEN) 100 UNIT/ML injection      1 unit as needed for any blood sugar over 150          . Insulin Pen Needle (B-D ULTRAFINE III SHORT PEN) 31G X 8 MM MISC      Tid          . EXPIRED: levothyroxine (SYNTHROID, LEVOTHROID) 100 MCG tablet   Oral   Take 1 tablet (100 mcg total) by mouth daily.   30 tablet   5   . levothyroxine (SYNTHROID, LEVOTHROID) 100 MCG tablet   Oral   Take 100 mcg by mouth daily before breakfast.         . oxyCODONE-acetaminophen (PERCOCET/ROXICET) 5-325 MG per tablet   Oral   Take 2 tablets by mouth every 4 (four) hours as needed for severe pain.   15 tablet   0   . sulfamethoxazole-trimethoprim (BACTRIM DS,SEPTRA DS) 800-160 MG per tablet   Oral   Take 1 tablet by mouth 2 (two) times daily.   14 tablet   0    BP 100/79  Pulse 76  Temp(Src) 98.2 F (36.8 C) (Oral)  Resp 16  Ht 5\' 7"  (1.702 m)  Wt 115 lb (52.164  kg)  BMI 18.01 kg/m2  SpO2 99%  LMP 08/12/2013 Physical Exam  Nursing note and vitals reviewed. Constitutional: She is oriented to person, place, and time. She appears well-developed and well-nourished.  HENT:  Head: Normocephalic.  Eyes: EOM are normal. Pupils are equal, round, and reactive to light.  Neck: Normal range of motion.  Pulmonary/Chest: Effort normal.  Abdominal: She exhibits no distension.  Musculoskeletal: Normal range of motion.  Neurological: She is alert and oriented to person, place, and time.  Skin: There is erythema.  2cm abscess left lower abdomen  Psychiatric: She has a normal mood and affect.    ED Course  INCISION AND DRAINAGE Date/Time: 09/14/2013 10:18 PM Performed by: Elson Areas Authorized by: Elson Areas Consent: Verbal consent obtained. Consent given by: patient Patient understanding: patient states understanding of the procedure being performed Type: abscess Anesthesia: local infiltration Local anesthetic: lidocaine 2% with  epinephrine Patient sedated: no Scalpel size: 11 Incision type: single straight Complexity: simple Drainage: purulent Wound treatment: wound left open Patient tolerance: Patient tolerated the procedure well with no immediate complications.   (including critical care time) Labs Review Labs Reviewed - No data to display Imaging Review No results found.   EKG Interpretation None      MDM   Final diagnoses:  Abscess of right groin    Percocet and bactrim.    Pt advised to return if any problems.    Lonia Skinner [place], PA-C 09/14/13 2218

## 2013-09-14 NOTE — Discharge Instructions (Signed)
Abscess An abscess is an infected area that contains a collection of pus and debris.It can occur in almost any part of the body. An abscess is also known as a furuncle or boil. CAUSES  An abscess occurs when tissue gets infected. This can occur from blockage of oil or sweat glands, infection of hair follicles, or a minor injury to the skin. As the body tries to fight the infection, pus collects in the area and creates pressure under the skin. This pressure causes pain. People with weakened immune systems have difficulty fighting infections and get certain abscesses more often.  SYMPTOMS Usually an abscess develops on the skin and becomes a painful mass that is red, warm, and tender. If the abscess forms under the skin, you may feel a moveable soft area under the skin. Some abscesses break open (rupture) on their own, but most will continue to get worse without care. The infection can spread deeper into the body and eventually into the bloodstream, causing you to feel ill.  DIAGNOSIS  Your caregiver will take your medical history and perform a physical exam. A sample of fluid may also be taken from the abscess to determine what is causing your infection. TREATMENT  Your caregiver may prescribe antibiotic medicines to fight the infection. However, taking antibiotics alone usually does not cure an abscess. Your caregiver may need to make a small cut (incision) in the abscess to drain the pus. In some cases, gauze is packed into the abscess to reduce pain and to continue draining the area. HOME CARE INSTRUCTIONS   Only take over-the-counter or prescription medicines for pain, discomfort, or fever as directed by your caregiver.  If you were prescribed antibiotics, take them as directed. Finish them even if you start to feel better.  If gauze is used, follow your caregiver's directions for changing the gauze.  To avoid spreading the infection:  Keep your draining abscess covered with a  bandage.  Wash your hands well.  Do not share personal care items, towels, or whirlpools with others.  Avoid skin contact with others.  Keep your skin and clothes clean around the abscess.  Keep all follow-up appointments as directed by your caregiver. SEEK MEDICAL CARE IF:   You have increased pain, swelling, redness, fluid drainage, or bleeding.  You have muscle aches, chills, or a general ill feeling.  You have a fever. MAKE SURE YOU:   Understand these instructions.  Will watch your condition.  Will get help right away if you are not doing well or get worse. Document Released: 03/12/2005 Document Revised: 12/02/2011 Document Reviewed: 08/15/2011 [employer] Patient Information 2014 [employer].  

## 2016-09-05 ENCOUNTER — Encounter (HOSPITAL_COMMUNITY): Payer: Self-pay

## 2016-09-05 NOTE — Progress Notes (Signed)
Contacted Ms. Manske as requested by Dr. Dois DavenportSandra Rivard to schedule a NICU NAS tour. Patient thanked me for the call but declined tour stating that [facility]. Encouraged her to call back to schedule if her plans change.

## 2018-06-03 MED ORDER — SODIUM CHLORIDE FLUSH 0.9 % IV SOLN
10.00 | INTRAVENOUS | Status: DC
Start: ? — End: 2018-06-03

## 2018-06-03 MED ORDER — ASPIRIN EC 81 MG PO TBEC
81.00 | DELAYED_RELEASE_TABLET | ORAL | Status: DC
Start: 2018-06-04 — End: 2018-06-03

## 2018-06-03 MED ORDER — LEVOTHYROXINE SODIUM 25 MCG PO TABS
25.00 | ORAL_TABLET | ORAL | Status: DC
Start: 2018-06-04 — End: 2018-06-03

## 2018-06-03 MED ORDER — METHADONE HCL 10 MG/ML PO CONC
60.00 | ORAL | Status: DC
Start: 2018-06-04 — End: 2018-06-03

## 2018-06-03 MED ORDER — LACTATED RINGERS IV SOLN
500.00 | INTRAVENOUS | Status: DC
Start: ? — End: 2018-06-03

## 2018-06-03 MED ORDER — SODIUM CHLORIDE FLUSH 0.9 % IV SOLN
10.00 | INTRAVENOUS | Status: DC
Start: 2018-06-03 — End: 2018-06-03

## 2018-06-13 MED ORDER — TETANUS-DIPHTH-ACELL PERTUSSIS 5-2.5-18.5 LF-MCG/0.5 IM SUSP
0.50 | INTRAMUSCULAR | Status: DC
Start: ? — End: 2018-06-13

## 2018-06-13 MED ORDER — METHADONE HCL 10 MG/ML PO CONC
70.00 | ORAL | Status: DC
Start: 2018-06-14 — End: 2018-06-13

## 2018-06-13 MED ORDER — GENERIC EXTERNAL MEDICATION
5.00 | Status: DC
Start: ? — End: 2018-06-13

## 2018-06-13 MED ORDER — BENZOCAINE-MENTHOL 20-0.5 % EX AERO
1.00 | INHALATION_SPRAY | CUTANEOUS | Status: DC
Start: ? — End: 2018-06-13

## 2018-06-13 MED ORDER — DOCUSATE SODIUM 100 MG PO CAPS
100.00 | ORAL_CAPSULE | ORAL | Status: DC
Start: ? — End: 2018-06-13

## 2018-06-13 MED ORDER — OXYCODONE HCL 5 MG PO TABS
5.00 | ORAL_TABLET | ORAL | Status: DC
Start: ? — End: 2018-06-13

## 2018-06-13 MED ORDER — OXYCODONE HCL 5 MG PO TABS
10.00 | ORAL_TABLET | ORAL | Status: DC
Start: ? — End: 2018-06-13

## 2018-06-13 MED ORDER — MEASLES, MUMPS & RUBELLA VAC IJ SOLR
0.50 | INTRAMUSCULAR | Status: DC
Start: ? — End: 2018-06-13

## 2018-06-13 MED ORDER — DIPHENHYDRAMINE HCL 25 MG PO CAPS
25.00 | ORAL_CAPSULE | ORAL | Status: DC
Start: ? — End: 2018-06-13

## 2018-06-13 MED ORDER — GENERIC EXTERNAL MEDICATION
4.00 | Status: DC
Start: ? — End: 2018-06-13

## 2018-06-13 MED ORDER — WITCH HAZEL-GLYCERIN EX PADS
1.00 | MEDICATED_PAD | CUTANEOUS | Status: DC
Start: ? — End: 2018-06-13

## 2018-06-13 MED ORDER — MAGNESIUM HYDROXIDE 400 MG/5ML PO SUSP
30.00 | ORAL | Status: DC
Start: ? — End: 2018-06-13

## 2018-06-13 MED ORDER — OXYTOCIN-SODIUM CHLORIDE 30-0.9 UT/500ML-% IV SOLN
30.00 | INTRAVENOUS | Status: DC
Start: ? — End: 2018-06-13

## 2018-06-13 MED ORDER — PRENATAL 27-0.8 MG PO TABS
1.00 | ORAL_TABLET | ORAL | Status: DC
Start: 2018-06-14 — End: 2018-06-13

## 2018-06-13 MED ORDER — LEVOTHYROXINE SODIUM 50 MCG PO TABS
50.00 | ORAL_TABLET | ORAL | Status: DC
Start: 2018-06-14 — End: 2018-06-13

## 2018-06-13 MED ORDER — ENEMA 7-19 GM/118ML RE ENEM
1.00 | ENEMA | RECTAL | Status: DC
Start: ? — End: 2018-06-13

## 2018-06-13 MED ORDER — ACETAMINOPHEN 325 MG PO TABS
650.00 | ORAL_TABLET | ORAL | Status: DC
Start: ? — End: 2018-06-13

## 2018-06-13 MED ORDER — GENERIC EXTERNAL MEDICATION
600.00 | Status: DC
Start: 2018-06-13 — End: 2018-06-13

## 2018-06-13 MED ORDER — SIMETHICONE 80 MG PO CHEW
80.00 | CHEWABLE_TABLET | ORAL | Status: DC
Start: ? — End: 2018-06-13

## 2018-06-13 MED ORDER — ALUMINUM-MAGNESIUM-SIMETHICONE 200-200-20 MG/5ML PO SUSP
30.00 | ORAL | Status: DC
Start: ? — End: 2018-06-13

## 2018-06-13 MED ORDER — SODIUM CHLORIDE FLUSH 0.9 % IV SOLN
10.00 | INTRAVENOUS | Status: DC
Start: ? — End: 2018-06-13

## 2018-06-13 MED ORDER — BISACODYL 10 MG RE SUPP
10.00 | RECTAL | Status: DC
Start: ? — End: 2018-06-13

## 2018-06-13 MED ORDER — VARICELLA VIRUS VACCINE LIVE 1350 PFU/0.5ML ~~LOC~~ INJ
0.50 | INJECTION | SUBCUTANEOUS | Status: DC
Start: ? — End: 2018-06-13

## 2018-06-13 MED ORDER — SODIUM CHLORIDE FLUSH 0.9 % IV SOLN
10.00 | INTRAVENOUS | Status: DC
Start: 2018-06-13 — End: 2018-06-13
